# Patient Record
Sex: Male | Born: 1995 | Hispanic: Yes | Marital: Single | Smoking: Current some day smoker
Health system: Southern US, Community
[De-identification: ages and names within clinical notes are randomized; demographics above are authoritative.]

---

## 2020-03-03 ENCOUNTER — Other Ambulatory Visit: Payer: Self-pay

## 2020-03-03 ENCOUNTER — Emergency Department
Admission: EM | Admit: 2020-03-03 | Discharge: 2020-03-03 | Disposition: A | Discharge status: Home / Self Care | Payer: Self-pay | Source: Home / Self Care

## 2020-03-03 DIAGNOSIS — K1379 Other lesions of oral mucosa: Secondary | ICD-10-CM

## 2020-03-03 DIAGNOSIS — Z711 Person with feared health complaint in whom no diagnosis is made: Secondary | ICD-10-CM

## 2020-03-03 NOTE — ED Triage Notes (Signed)
Pt here today for STD testing. Pt also c/o fatigue since Monday. Also noticed bleeding in back of throat while eating today.

## 2020-03-03 NOTE — ED Provider Notes (Signed)
[facility]    CSN: [id number] Arrival date & time: 03/03/20  1621      History   Chief Complaint Chief Complaint  Patient presents with  . Std testing    HPI Antonio Collier is a 24 y.o. male.   HPI Antonio Collier is a 24 y.o. male presenting to UC with request of STD testing after noticing a red blister in the back of his throat earlier today. Denies pain.  He also reports mild fatigue. No known exposure to STDs. He has had unprotected intercourse but denies urinary symptoms. Denies dysuria, penile discharge, testicular pain or swelling. Denies rashes. Denies fever or chills.  Denies engaging in oral sex. Denies sick contacts or recent travel. Pt not concerned for strep throat or mono.    History reviewed. No pertinent past medical history.  There are no problems to display for this patient.   History reviewed. No pertinent surgical history.     Home Medications    Prior to Admission medications   Not on File    Family History History reviewed. No pertinent family history.  Social History Social History   Tobacco Use  . Smoking status: Never Smoker  . Smokeless tobacco: Never Used  Substance Use Topics  . Alcohol use: Yes    Comment: occ  . Drug use: Not on file     Allergies   Patient has no known allergies.   Review of Systems Review of Systems  Constitutional: Negative for chills and fever.  HENT: Positive for mouth sores. Negative for congestion, ear pain, sore throat, trouble swallowing and voice change.   Respiratory: Negative for cough and shortness of breath.   Cardiovascular: Negative for chest pain and palpitations.  Gastrointestinal: Negative for abdominal pain, diarrhea, nausea and vomiting.  Genitourinary: Negative for discharge, dysuria, frequency, genital sores and hematuria.  Musculoskeletal: Negative for arthralgias, back pain and myalgias.  Skin: Negative for rash.  All other systems reviewed and are negative.     Physical Exam Triage Vital Signs ED Triage Vitals [03/03/20 1643]  Enc Vitals Group     BP 124/80     Pulse Rate (!) 101     Resp 18     Temp 98.4 F (36.9 C)     Temp Source Oral     SpO2 97 %     Weight      Height      Head Circumference      Peak Flow      Pain Score 0     Pain Loc      Pain Edu?      Excl. in [place]?    No data found.  Updated Vital Signs BP 124/80 (BP Location: Left Arm)   Pulse (!) 101   Temp 98.4 F (36.9 C) (Oral)   Resp 18   SpO2 97%   Visual Acuity Right Eye Distance:   Left Eye Distance:   Bilateral Distance:    Right Eye Near:   Left Eye Near:    Bilateral Near:     Physical Exam Vitals and nursing note reviewed.  Constitutional:      Appearance: Normal appearance. He is well-developed.  HENT:     Head: Normocephalic and atraumatic.     Right Ear: Tympanic membrane and ear canal normal.     Left Ear: Tympanic membrane and ear canal normal.     Nose: Nose normal.     Mouth/Throat:     Lips: Pink.  Mouth: Mucous membranes are moist. No oral lesions.     Pharynx: Oropharynx is clear. Uvula midline. Posterior oropharyngeal erythema present. No pharyngeal swelling, oropharyngeal exudate or uvula swelling.     Tonsils: No tonsillar exudate or tonsillar abscesses.  Cardiovascular:     Rate and Rhythm: Normal rate and regular rhythm.  Pulmonary:     Effort: Pulmonary effort is normal. No respiratory distress.     Breath sounds: Normal breath sounds.  Abdominal:     Palpations: Abdomen is soft.     Tenderness: There is no abdominal tenderness. There is no right CVA tenderness or left CVA tenderness.  Genitourinary:    Comments: Deferred. Pt denies GI symptoms Musculoskeletal:        General: Normal range of motion.     Cervical back: Normal range of motion.  Skin:    General: Skin is warm and dry.  Neurological:     Mental Status: He is alert and oriented to person, place, and time.  Psychiatric:        Behavior: Behavior  normal.      UC Treatments / Results  Labs (all labs ordered are listed, but only abnormal results are displayed) Labs Reviewed  C. TRACHOMATIS/N. GONORRHOEAE RNA  HIV ANTIBODY (ROUTINE TESTING W REFLEX)  RPR    EKG   Radiology No results found.  Procedures Procedures (including critical care time)  Medications Ordered in UC Medications - No data to display  Initial Impression / Assessment and Plan / UC Course  I have reviewed the triage vital signs and the nursing notes.  Pertinent labs & imaging results that were available during my care of the patient were reviewed by me and considered in my medical decision making (see chart for details).     Throat is mildly erythematous but no oral lesions noted. Discussed labs with pt Pt declined strep test. Discussed with pt, urine will test for GC/chlamydria, would need oral swab to test throat for same, however, if no oral sex, then unlikely pt has GC/chlamydria in throat.   Pt agreeable to have urine tested and check blood for HIV and syphilis  Discussed safe sex F/u with PCP as needed AVS provided  Final Clinical Impressions(s) / UC Diagnoses   Final diagnoses:  Concern about STD in male without diagnosis  Mouth sore   Discharge Instructions   None    ED Prescriptions    None     PDMP not reviewed this encounter.   Lurene Shadow, [place] 03/03/20 1949

## 2020-03-04 LAB — C. TRACHOMATIS/N. GONORRHOEAE RNA
C. trachomatis RNA, TMA: NOT DETECTED
N. gonorrhoeae RNA, TMA: NOT DETECTED

## 2020-03-04 LAB — HIV ANTIBODY (ROUTINE TESTING W REFLEX): HIV 1&2 Ab, 4th Generation: NONREACTIVE

## 2020-03-04 LAB — RPR: RPR Ser Ql: NONREACTIVE

## 2020-04-04 ENCOUNTER — Emergency Department (INDEPENDENT_AMBULATORY_CARE_PROVIDER_SITE_OTHER): Payer: Self-pay

## 2020-04-04 ENCOUNTER — Emergency Department
Admission: EM | Admit: 2020-04-04 | Discharge: 2020-04-04 | Disposition: A | Discharge status: Home / Self Care | Payer: Self-pay | Source: Home / Self Care

## 2020-04-04 ENCOUNTER — Other Ambulatory Visit: Payer: Self-pay

## 2020-04-04 ENCOUNTER — Encounter: Payer: Self-pay | Admitting: Emergency Medicine

## 2020-04-04 DIAGNOSIS — N50812 Left testicular pain: Secondary | ICD-10-CM

## 2020-04-04 DIAGNOSIS — N503 Cyst of epididymis: Secondary | ICD-10-CM

## 2020-04-04 MED ORDER — ACETAMINOPHEN 500 MG PO TABS
1000.0000 mg | ORAL_TABLET | Freq: Once | ORAL | Status: AC
  Administered 2020-04-04: 1000 mg via ORAL

## 2020-04-04 NOTE — ED Provider Notes (Signed)
[facility]    CSN: [id number] Arrival date & time: 04/04/20  1159      History   Chief Complaint Chief Complaint  Patient presents with  . Groin Pain    left    HPI Antonio Collier is a 24 y.o. male.   HPI  Antonio Collier is a 24 y.o. male presenting to UC with c/o a small tender bump on top of Left testicle that he noticed 3 days ago, that started with a sharp pain. Area only hurts if touched. Denies urinary symptoms or penile discharge. He tested negative for gonorrhea, chlamydia, HIV and syphilis 1 month ago.     History reviewed. No pertinent past medical history.  There are no problems to display for this patient.   History reviewed. No pertinent surgical history.     Home Medications    Prior to Admission medications   Not on File    Family History Family History  Problem Relation Age of Onset  . Hypertension Mother   . Hypertension Father   . High Cholesterol Father   . Healthy Brother   . Healthy Brother   . Healthy Brother     Social History Social History   Tobacco Use  . Smoking status: Current Some Day Smoker    Types: E-cigarettes  . Smokeless tobacco: Never Used  . Tobacco comment: only when he drinks  Vaping Use  . Vaping Use: Some days  . Substances: Nicotine  Substance Use Topics  . Alcohol use: Yes    Alcohol/week: 2.0 standard drinks    Types: 2 Cans of beer per week  . Drug use: Never     Allergies   Patient has no known allergies.   Review of Systems Review of Systems  Constitutional: Negative for chills and fever.  Genitourinary: Positive for testicular pain (Left). Negative for discharge, dysuria, genital sores and scrotal swelling.     Physical Exam Triage Vital Signs ED Triage Vitals  Enc Vitals Group     BP 04/04/20 1213 123/75     Pulse Rate 04/04/20 1213 84     Resp 04/04/20 1213 15     Temp 04/04/20 1213 99.1 F (37.3 C)     Temp Source 04/04/20 1213 Oral     SpO2 04/04/20 1213 97 %      Weight 04/04/20 1214 140 lb (63.5 kg)     Height 04/04/20 1214 5\' 9"  (1.753 m)     Head Circumference --      Peak Flow --      Pain Score 04/04/20 1214 0     Pain Loc --      Pain Edu? --      Excl. in [place]? --    No data found.  Updated Vital Signs BP 123/75 (BP Location: Right Arm)   Pulse 84   Temp 99.1 F (37.3 C) (Oral)   Resp 15   Ht 5\' 9"  (1.753 m)   Wt 140 lb (63.5 kg)   SpO2 97%   BMI 20.67 kg/m   Visual Acuity Right Eye Distance:   Left Eye Distance:   Bilateral Distance:    Right Eye Near:   Left Eye Near:    Bilateral Near:     Physical Exam Vitals and nursing note reviewed. Exam conducted with a chaperone present.  Constitutional:      Appearance: Normal appearance. He is well-developed.  HENT:     Head: Normocephalic and atraumatic.  Cardiovascular:  Rate and Rhythm: Normal rate.  Pulmonary:     Effort: Pulmonary effort is normal.  Genitourinary:    Penis: Normal. No discharge or lesions.      Testes:        Right: Mass, tenderness or swelling not present.        Left: Mass and tenderness present. Swelling not present.  Musculoskeletal:        General: Normal range of motion.     Cervical back: Normal range of motion.  Skin:    General: Skin is warm and dry.  Neurological:     Mental Status: He is alert and oriented to person, place, and time.  Psychiatric:        Behavior: Behavior normal.      UC Treatments / Results  Labs (all labs ordered are listed, but only abnormal results are displayed) Labs Reviewed - No data to display  EKG   Radiology US SCROTUM W/DOPPLER  Result Date: 04/04/2020 CLINICAL DATA:  Acute left testicular pain. EXAM: SCROTAL ULTRASOUND DOPPLER ULTRASOUND OF THE TESTICLES TECHNIQUE: Complete ultrasound examination of the testicles, epididymis, and other scrotal structures was performed. Color and spectral Doppler ultrasound were also utilized to evaluate blood flow to the testicles. COMPARISON:  None.  FINDINGS: Right testicle Measurements: 4.6 x 2.9 x 2.6 cm. No mass or microlithiasis visualized. Left testicle Measurements: 3.9 x 2.8 x 2.7 cm. No mass or microlithiasis visualized. Right epididymis:  4 mm cyst is noted. Left epididymis: 1.3 cm slightly complex and possibly hemorrhagic cyst is noted. Hydrocele:  None visualized. Varicocele:  Mild left varicocele is noted. Pulsed Doppler interrogation of both testes demonstrates normal low resistance arterial and venous waveforms bilaterally. IMPRESSION: No evidence of testicular mass or torsion. Mild left varicocele is noted. Bilateral epididymal cysts are noted, including 1.3 cm slightly complex and possibly hemorrhagic left epididymal cyst. Electronically Signed   By: Marijo Conception M.D.   On: 04/04/2020 13:24    Procedures Procedures (including critical care time)  Medications Ordered in UC Medications  acetaminophen (TYLENOL) tablet 1,000 mg (1,000 mg Oral Given 04/04/20 1225)    Initial Impression / Assessment and Plan / UC Course  I have reviewed the triage vital signs and the nursing notes.  Pertinent labs & imaging results that were available during my care of the patient were reviewed by me and considered in my medical decision making (see chart for details).     Discussed imaging with pt Encouraged f/u with urology as needed. AVS provided  Final Clinical Impressions(s) / UC Diagnoses   Final diagnoses:  Left testicular pain  Epididymal cyst     Discharge Instructions      The ultrasound findings were consistent with cyst, which is smaller than a spermatocele. The pain should resolve on its own with time.   Call to schedule a follow up appointment with a urologist for further evaluation and treatment if pain not improving in 1 week.  Follow up sooner if area gets larger, more painful or other new concerning symptoms develop.     ED Prescriptions    None     PDMP not reviewed this encounter.   Noe Gens,  [place] 04/04/20 1952

## 2020-04-04 NOTE — Discharge Instructions (Signed)
°  The ultrasound findings were consistent with cyst, which is smaller than a spermatocele. The pain should resolve on its own with time.   Call to schedule a follow up appointment with a urologist for further evaluation and treatment if pain not improving in 1 week.  Follow up sooner if area gets larger, more painful or other new concerning symptoms develop.

## 2020-04-04 NOTE — ED Triage Notes (Signed)
Pt noticed small hard lump to top of  left testicle x 3 days  Denies any problems with urination STD tests on 5/20 were all negative - pt updated  No COVID vaccine No fever Pain only on palpation- none currently

## 2020-10-25 ENCOUNTER — Other Ambulatory Visit: Payer: Self-pay

## 2020-10-25 ENCOUNTER — Emergency Department
Admission: EM | Admit: 2020-10-25 | Discharge: 2020-10-25 | Disposition: A | Discharge status: Home / Self Care | Payer: Self-pay | Source: Home / Self Care

## 2020-10-25 DIAGNOSIS — Z202 Contact with and (suspected) exposure to infections with a predominantly sexual mode of transmission: Secondary | ICD-10-CM

## 2020-10-25 DIAGNOSIS — R369 Urethral discharge, unspecified: Secondary | ICD-10-CM

## 2020-10-25 NOTE — ED Triage Notes (Addendum)
Pt here today for STI testing. States partner tested pos for Chlamydia. Says he is having some burning with urination. Some morning penile discharge. Was tested in May and was negative.

## 2020-10-25 NOTE — Discharge Instructions (Addendum)
Your lab sample should result within the next 3 to 5 days.  Our office will notify you if any treatment is warranted.  Recommend activating your MyChart for you to be able to view results once they are available.

## 2020-10-25 NOTE — ED Provider Notes (Signed)
[facility]    CSN: [id number] Arrival date & time: 10/25/20  1113      History   Chief Complaint Chief Complaint  Patient presents with  . STI testing    HPI Antonio [facility]lier is a 25 y.o. male.   HPI  Patient presents today with concern of possible STD.  Patient reports that his girlfriend reported that she had an active chlamydia infection.  Patient endorses some mild penile discharge and had multiple questions regarding how to acquire a chlamydial infection.  Patient has been seen here previously with concern for exposure to STD and has had prior negative cytology.  Patient declines any testing for HIV or RPR.  History reviewed. No pertinent past medical history.  There are no problems to display for this patient.   History reviewed. No pertinent surgical history.     Home Medications    Prior to Admission medications   Not on File    Family History Family History  Problem Relation Age of Onset  . Hypertension Mother   . Hypertension Father   . High Cholesterol Father   . Healthy Brother   . Healthy Brother   . Healthy Brother     Social History Social History   Tobacco Use  . Smoking status: Current Some Day Smoker    Types: E-cigarettes  . Smokeless tobacco: Never Used  . Tobacco comment: only when he drinks  Vaping Use  . Vaping Use: Some days  . Substances: Nicotine  Substance Use Topics  . Alcohol use: Yes    Alcohol/week: 2.0 standard drinks    Types: 2 Cans of beer per week  . Drug use: Never     Allergies   Patient has no known allergies.   Review of Systems Review of Systems Pertinent negatives listed in HPI   Physical Exam Triage Vital Signs ED Triage Vitals  Enc Vitals Group     BP 10/25/20 1307 132/77     Pulse Rate 10/25/20 1307 86     Resp --      Temp 10/25/20 1307 99 F (37.2 C)     Temp Source 10/25/20 1307 Oral     SpO2 10/25/20 1307 97 %     Weight --      Height --      Head Circumference --       Peak Flow --      Pain Score 10/25/20 1311 0     Pain Loc --      Pain Edu? --      Excl. in [place]? --    No data found.  Updated Vital Signs BP 132/77 (BP Location: Right Arm)   Pulse 86   Temp 99 F (37.2 C) (Oral)   SpO2 97%   Visual Acuity Right Eye Distance:   Left Eye Distance:   Bilateral Distance:    Right Eye Near:   Left Eye Near:    Bilateral Near:     Physical Exam General appearance: alert, well developed, well nourished, cooperative Head: Normocephalic, without obvious abnormality, atraumatic Respiratory: Respirations even and unlabored, normal respiratory rate Heart: Rate and rhythm normal.  Extremities: No gross deformities Skin: Skin color, texture, turgor normal. No rashes seen  Psych: Appropriate mood and affect. Neurologic: Mental status: Alert, oriented to person, place, and time, thought content appropriate. Urine sample self collected Genital exam deferred.  UC Treatments / Results  Labs (all labs ordered are listed, but only abnormal results are displayed) Labs  Reviewed  C. TRACHOMATIS/N. GONORRHOEAE RNA  GC/CHLAMYDIA PROBE AMP    EKG   Radiology No results found.  Procedures Procedures (including critical care time)  Medications Ordered in UC Medications - No data to display  Initial Impression / Assessment and Plan / UC Course  I have reviewed the triage vital signs and the nursing notes.  Pertinent labs & imaging results that were available during my care of the patient were reviewed by me and considered in my medical decision making (see chart for details).    Concern for STD with penile discharge.  Patient advised to await cytology results prior to treatment.  Did explain that if cytology is positive for chlamydia he will receive a prescription for doxycycline 100 mg twice daily for 7 days.  Alternatively if he is positive for gonorrhea treatment will require him to return to the office for an antibiotic injection.  Patient did  verbalize understanding and agreement with plan.  Final Clinical Impressions(s) / UC Diagnoses   Final diagnoses:  Penile discharge  Possible exposure to STD     Discharge Instructions     Given previous history of negative cytology will await cytology results prior to treating you for any STDs. Your lab sample should result within the next 3 to 5 days.  Our office will notify you if any treatment is warranted.  Recommend activating your MyChart for you to be able to view results once they are available.   ED Prescriptions    None     PDMP not reviewed this encounter.   Bing Neighbors, FNP 10/25/20 1444

## 2020-10-26 ENCOUNTER — Other Ambulatory Visit: Payer: Self-pay | Admitting: Family Medicine

## 2020-10-26 LAB — C. TRACHOMATIS/N. GONORRHOEAE RNA
C. trachomatis RNA, TMA: DETECTED — AB
N. gonorrhoeae RNA, TMA: NOT DETECTED

## 2020-10-26 NOTE — Telephone Encounter (Signed)
Notify patient that his recent STD test is positive for chlamydia.  I am sending over doxycycline 100 mg twice daily for total 7 days.  He should complete all of the medication and abstain from having sexual intercourse until the medication has been completed.  He also needs to confirm that his partner has also been treated successfully with medication.

## 2020-10-27 NOTE — Telephone Encounter (Signed)
Megan, Please see original message sent to Emilio Math to address on 10/26/20.  Patient has not been notifed of abnormal result and treatment. Could you please have someone from the clinical team notify the patient of results and release pending treatment to pharmacy patient desires. I am out of office until 1/15.  Kindest regards,  Joaquin Courts, FNP

## 2020-10-28 ENCOUNTER — Telehealth: Payer: Self-pay

## 2020-10-28 MED ORDER — DOXYCYCLINE HYCLATE 100 MG PO CAPS: 100.0000 mg | ORAL_CAPSULE | Freq: Two times a day (BID) | ORAL | 0 refills | Status: AC

## 2020-10-28 NOTE — Telephone Encounter (Signed)
Patient notified of positive chlamydia test results. Aware that doxy was called into his pharmacy of choice. Safe sex practices discussed, questions asked and answers provided. Pt verbalizes understanding.

## 2020-10-28 NOTE — Telephone Encounter (Signed)
Prescription for doxy re-called in to pt's pharmacy of choice. Pt aware.

## 2020-11-05 IMAGING — US US SCROTUM W/ DOPPLER COMPLETE
1 series · 14 of 25 positions shown · non-contrast
Comparison: None.

CLINICAL DATA: Acute left testicular pain.

EXAM:
SCROTAL ULTRASOUND
DOPPLER ULTRASOUND OF THE TESTICLES
TECHNIQUE: Complete ultrasound examination of the testicles, epididymis, and
other scrotal structures was performed. Color and spectral Doppler
ultrasound were also utilized to evaluate blood flow to the
testicles.

[Series 1: us scrotum w/ doppler complete · 0.06mm/px · 14 of 54 slices shown]
[im 1/54]
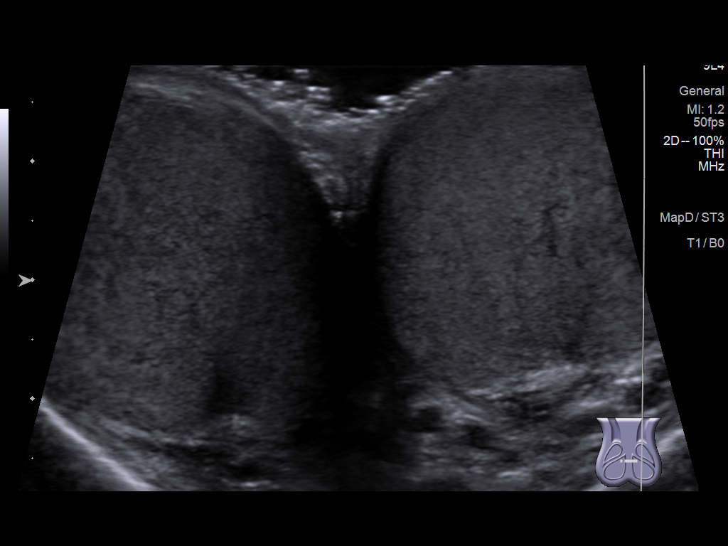
[im 5/54]
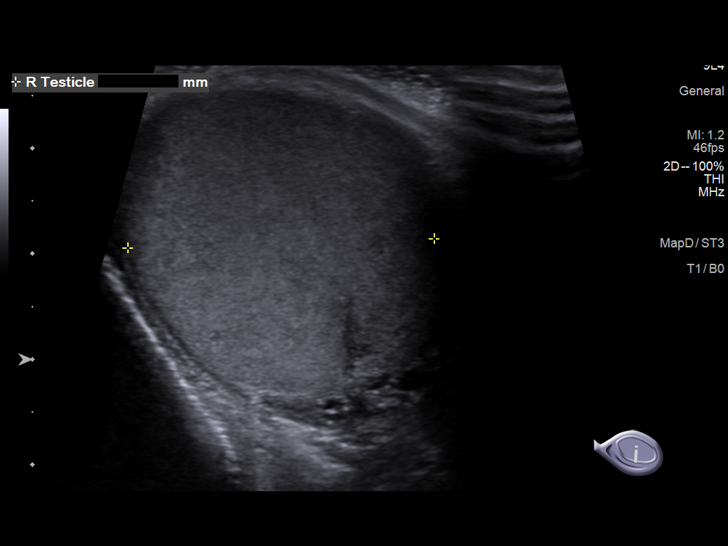
[im 9/54]
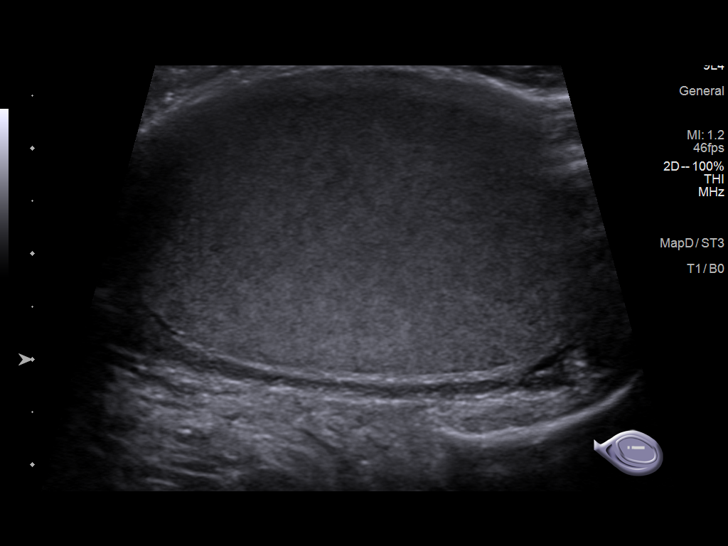
[im 14/54]
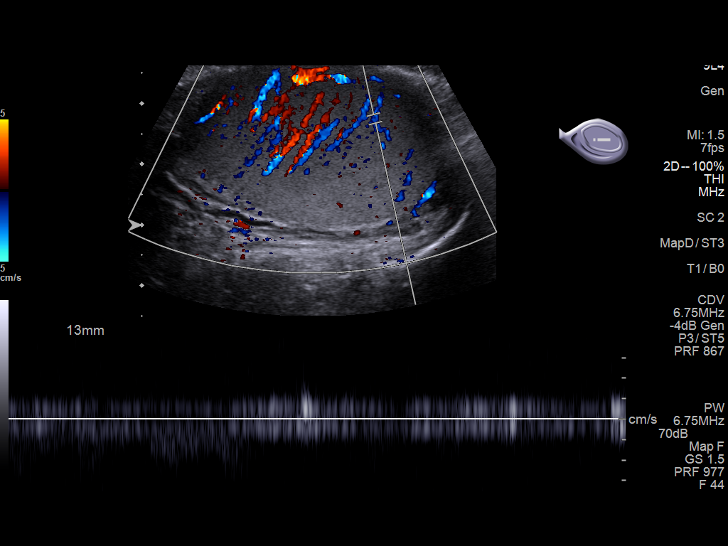
[im 18/54]
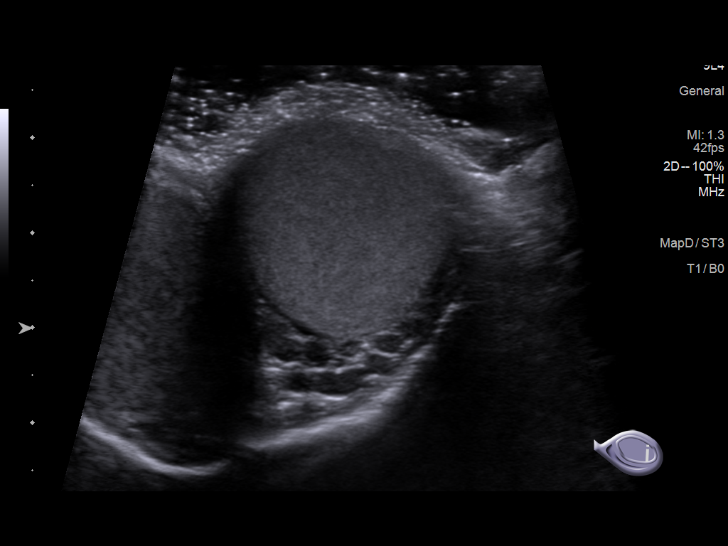
[im 20/54]
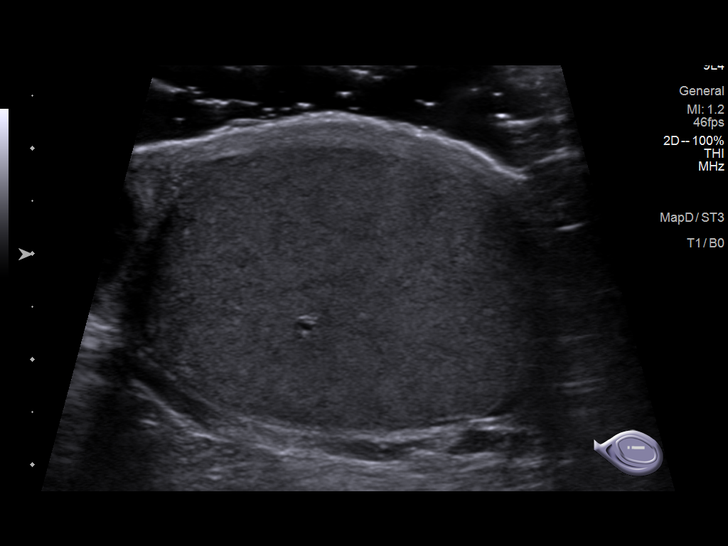
[im 25/54]
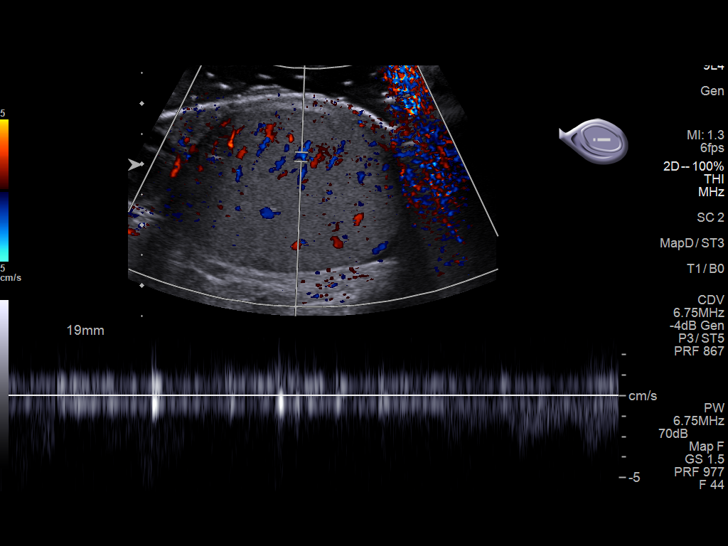
[im 29/54]
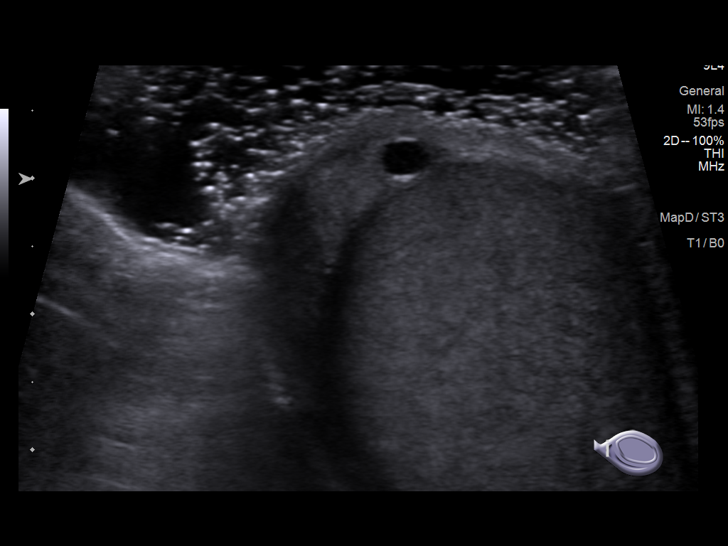
[im 34/54]
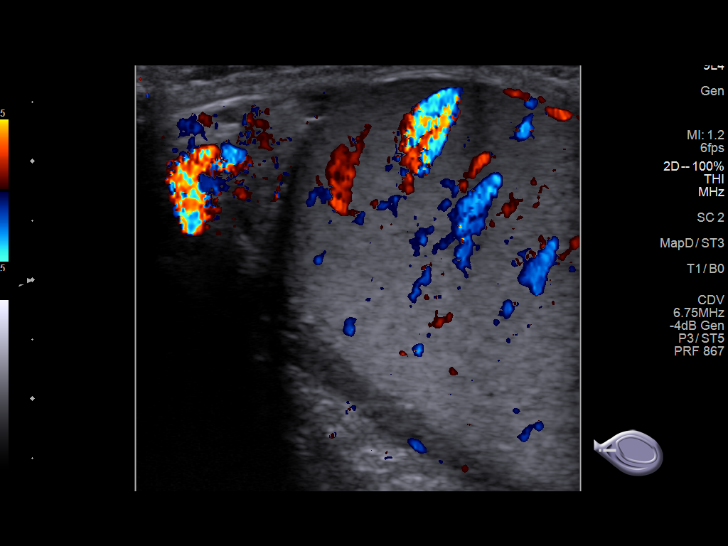
[im 36/54]
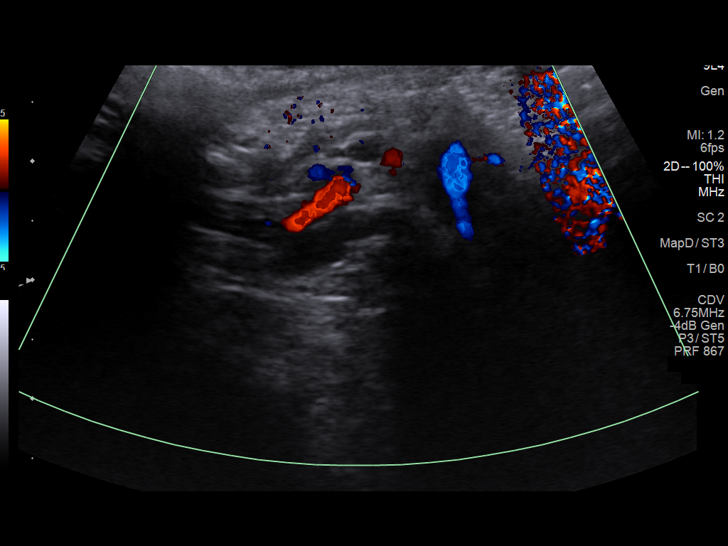
[im 40/54]
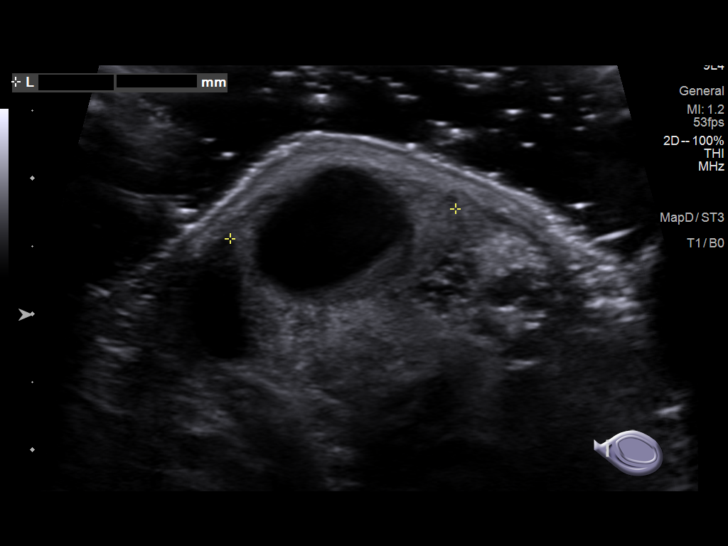
[im 45/54]
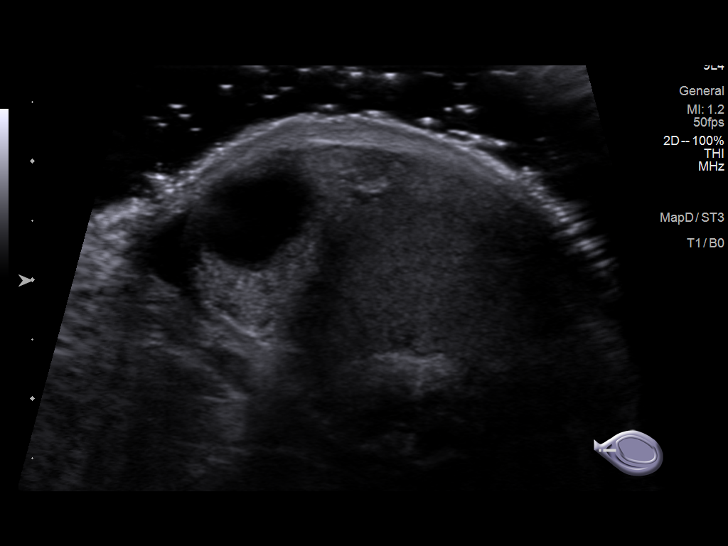
[im 49/54]
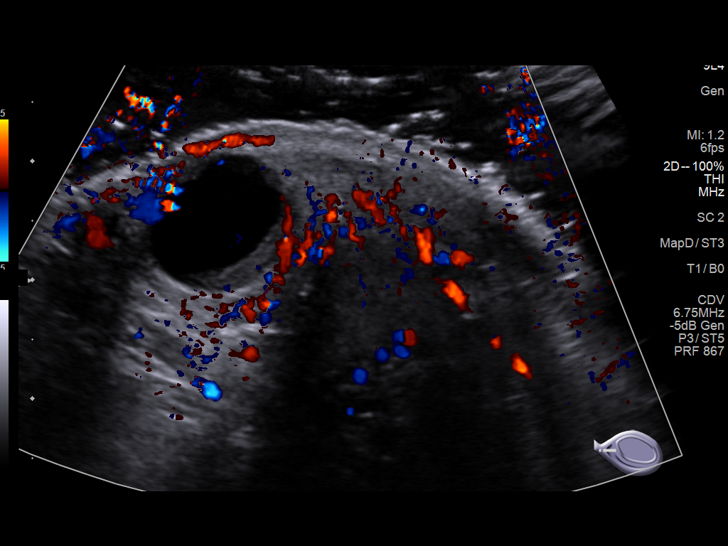
[im 54/54]
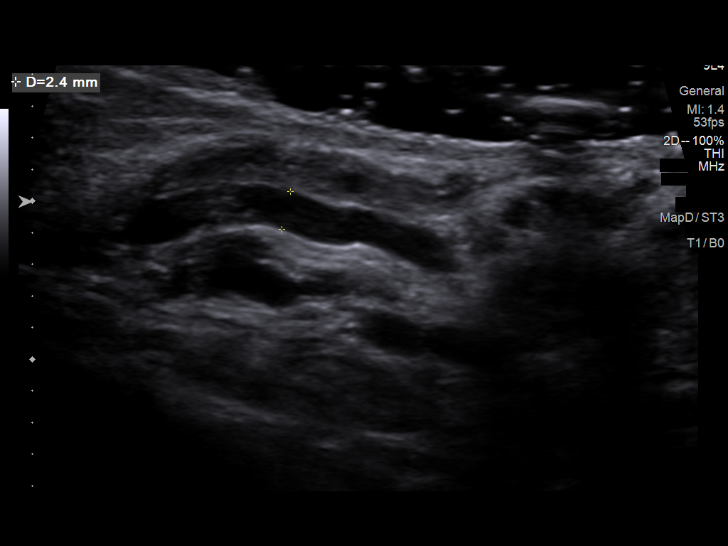

[14 of 25 positions shown; findings below may reference images not displayed]

FINDINGS: Right testicle

Measurements: 4.6 x 2.9 x 2.6 cm. No mass or microlithiasis
visualized.

Left testicle

Measurements: 3.9 x 2.8 x 2.7 cm. No mass or microlithiasis
visualized.

Right epididymis:  4 mm cyst is noted.

Left epididymis: 1.3 cm slightly complex and possibly hemorrhagic
cyst is noted.

Hydrocele:  None visualized.

Varicocele:  Mild left varicocele is noted.

Pulsed Doppler interrogation of both testes demonstrates normal low
resistance arterial and venous waveforms bilaterally.
IMPRESSION: No evidence of testicular mass or torsion.

Mild left varicocele is noted.

Bilateral epididymal cysts are noted, including 1.3 cm slightly
complex and possibly hemorrhagic left epididymal cyst.
